# Patient Record
Sex: Female | Born: 1980 | Hispanic: Yes | Marital: Married | Smoking: Never smoker
Health system: Southern US, Community
[De-identification: ages and names within clinical notes are randomized; demographics above are authoritative.]

## PROBLEM LIST (undated history)

## (undated) DIAGNOSIS — Z789 Other specified health status: Secondary | ICD-10-CM

## (undated) HISTORY — PX: NO PAST SURGERIES: SHX2092

---

## 2015-06-09 NOTE — L&D Delivery Note (Signed)
Delivery Note Pt pushed for an hour and at 11:24 PM a viable female was delivered via Vaginal, Spontaneous Delivery (Presentation:LOA ;  ).  APGAR: 8, 9; weight pending .   Placenta status: delivered spontaneously; duncan , .  Cord:  with the following complications: none.  Anesthesia:  episiotomy Episiotomy: Median Lacerations: 2nd degree Suture Repair: 2.0 vicryl 4-0 vicryl Est. Blood Loss (mL): 150  Mom to postpartum.  Baby to Couplet care / Skin to Skin.  Cecilia Worema Banga 02/02/2016, 12:21 AM

## 2015-12-30 LAB — OB RESULTS CONSOLE GBS: GBS: NEGATIVE

## 2016-01-13 ENCOUNTER — Inpatient Hospital Stay (HOSPITAL_COMMUNITY)
Admission: AD | Admit: 2016-01-13 | Discharge: 2016-01-13 | Disposition: A | Discharge status: Home / Self Care | Payer: 59 | Source: Ambulatory Visit | Attending: Obstetrics and Gynecology | Admitting: Obstetrics and Gynecology

## 2016-01-13 ENCOUNTER — Inpatient Hospital Stay (HOSPITAL_COMMUNITY): Payer: 59

## 2016-01-13 ENCOUNTER — Encounter (HOSPITAL_COMMUNITY): Payer: Self-pay | Admitting: *Deleted

## 2016-01-13 DIAGNOSIS — R0602 Shortness of breath: Secondary | ICD-10-CM | POA: Insufficient documentation

## 2016-01-13 DIAGNOSIS — R0600 Dyspnea, unspecified: Secondary | ICD-10-CM | POA: Diagnosis not present

## 2016-01-13 DIAGNOSIS — O26893 Other specified pregnancy related conditions, third trimester: Secondary | ICD-10-CM | POA: Diagnosis not present

## 2016-01-13 DIAGNOSIS — Z8701 Personal history of pneumonia (recurrent): Secondary | ICD-10-CM | POA: Diagnosis not present

## 2016-01-13 DIAGNOSIS — J069 Acute upper respiratory infection, unspecified: Secondary | ICD-10-CM | POA: Insufficient documentation

## 2016-01-13 DIAGNOSIS — R05 Cough: Secondary | ICD-10-CM | POA: Insufficient documentation

## 2016-01-13 DIAGNOSIS — Z3A37 37 weeks gestation of pregnancy: Secondary | ICD-10-CM | POA: Diagnosis not present

## 2016-01-13 HISTORY — DX: Other specified health status: Z78.9

## 2016-01-13 LAB — CBC WITH DIFFERENTIAL/PLATELET
BAND NEUTROPHILS: 0 %
BASOS ABS: 0 10*3/uL (ref 0.0–0.1)
BASOS PCT: 0 %
Blasts: 0 %
EOS ABS: 0 10*3/uL (ref 0.0–0.7)
EOS PCT: 0 %
HCT: 37.6 % (ref 36.0–46.0)
HEMOGLOBIN: 12.8 g/dL (ref 12.0–15.0)
LYMPHS ABS: 1.8 10*3/uL (ref 0.7–4.0)
LYMPHS PCT: 17 %
MCH: 31.1 pg (ref 26.0–34.0)
MCHC: 34 g/dL (ref 30.0–36.0)
MCV: 91.3 fL (ref 78.0–100.0)
METAMYELOCYTES PCT: 0 %
MONO ABS: 0.5 10*3/uL (ref 0.1–1.0)
MYELOCYTES: 0 %
Monocytes Relative: 5 %
NEUTROS PCT: 78 %
Neutro Abs: 8.3 10*3/uL — ABNORMAL HIGH (ref 1.7–7.7)
Other: 0 %
PLATELETS: 251 10*3/uL (ref 150–400)
Promyelocytes Absolute: 0 %
RBC: 4.12 MIL/uL (ref 3.87–5.11)
RDW: 14.3 % (ref 11.5–15.5)
WBC: 10.6 10*3/uL — AB (ref 4.0–10.5)
nRBC: 0 /100 WBC

## 2016-01-13 LAB — URINALYSIS, ROUTINE W REFLEX MICROSCOPIC
BILIRUBIN URINE: NEGATIVE
Glucose, UA: NEGATIVE mg/dL
Hgb urine dipstick: NEGATIVE
KETONES UR: NEGATIVE mg/dL
NITRITE: NEGATIVE
Protein, ur: NEGATIVE mg/dL
pH: 6.5 (ref 5.0–8.0)

## 2016-01-13 LAB — URINE MICROSCOPIC-ADD ON

## 2016-01-13 NOTE — MAU Provider Note (Signed)
History     CSN: [id number]  Arrival date and time: 01/13/16 1402   First Provider Initiated Contact with Patient 01/13/16 1500      Chief Complaint  Patient presents with  . Shortness of Breath   HPI   Ms.Cassidy Schroeder is a 35 y.o. female here in MAU today with SOB. She started feeling sick 2 weeks ago; she started off having a cough, then head congestion. She feels that the symptoms never improved. 2 days ago she started experiencing shortness of breath that worsened with activity. She also feels short of breath while she is sitting in bed. The shortness of breath occurs all the time, day and night. Cleaning and moving around makes it worse. She has never had this before.   She has been taking robitussin for cough which is helping some.   She has a history of pneumonia. "This feels the same".  She denies fever.  + fetal movement, denies vaginal bleeding or leaking of fluid.   OB History    Gravida Para Term Preterm AB Living   0 3 2   SAB TAB Ectopic Multiple Live Births                  Past Medical History:  Diagnosis Date  . Medical history non-contributory     Past Surgical History:  Procedure Laterality Date  . NO PAST SURGERIES      Family History  Problem Relation Age of Onset  . Cancer Neg Hx   . Hypertension Neg Hx   . Diabetes Neg Hx     Social History  Substance Use Topics  . Smoking status: Never Smoker  . Smokeless tobacco: Never Used  . Alcohol use No    Allergies: No Known Allergies  Prescriptions Prior to Admission  Medication Sig Dispense Refill Last Dose  . guaiFENesin-dextromethorphan (ROBITUSSIN DM) 100-10 MG/5ML syrup Take 5 mLs by mouth every 4 (four) hours as needed for cough.   01/12/2016 at Unknown time  . Prenatal Vit-Fe Fumarate-FA (PRENATAL MULTIVITAMIN) TABS tablet Take 1 tablet by mouth daily at 12 noon.   01/13/2016 at Unknown time   Results for orders placed or performed during the hospital encounter of 01/13/16 (from  the past 48 hour(s))  Urinalysis, Routine w reflex microscopic (not at [facility])     Status: Abnormal   Collection Time: 01/13/16  2:15 PM  Result Value Ref Range   Color, Urine YELLOW YELLOW   APPearance CLEAR CLEAR   Specific Gravity, Urine <1.005 (L) 1.005 - 1.030   pH 6.5 5.0 - 8.0   Glucose, UA NEGATIVE NEGATIVE mg/dL   Hgb urine dipstick NEGATIVE NEGATIVE   Bilirubin Urine NEGATIVE NEGATIVE   Ketones, ur NEGATIVE NEGATIVE mg/dL   Protein, ur NEGATIVE NEGATIVE mg/dL   Nitrite NEGATIVE NEGATIVE   Leukocytes, UA SMALL (A) NEGATIVE  Urine microscopic-add on     Status: Abnormal   Collection Time: 01/13/16  2:15 PM  Result Value Ref Range   Squamous Epithelial / LPF 0-5 (A) NONE SEEN   WBC, UA 0-5 0 - 5 WBC/hpf   RBC / HPF 0-5 0 - 5 RBC/hpf   Bacteria, UA RARE (A) NONE SEEN  CBC with Differential     Status: Abnormal   Collection Time: 01/13/16  3:15 PM  Result Value Ref Range   WBC 10.6 (H) 4.0 - 10.5 K/uL   RBC 4.12 3.87 - 5.11 MIL/uL   Hemoglobin 12.8 12.0 - 15.0 g/dL  HCT 37.6 36.0 - 46.0 %   MCV 91.3 78.0 - 100.0 fL   MCH 31.1 26.0 - 34.0 pg   MCHC 34.0 30.0 - 36.0 g/dL   RDW 62.914.3 52.811.5 - 41.315.5 %   Platelets 251 150 - 400 K/uL   Neutrophils Relative % 78 %   Lymphocytes Relative 17 %   Monocytes Relative 5 %   Eosinophils Relative 0 %   Basophils Relative 0 %   Band Neutrophils 0 %   Metamyelocytes Relative 0 %   Myelocytes 0 %   Promyelocytes Absolute 0 %   Blasts 0 %   nRBC 0 0 /100 WBC   Other 0 %   Neutro Abs 8.3 (H) 1.7 - 7.7 K/uL   Lymphs Abs 1.8 0.7 - 4.0 K/uL   Monocytes Absolute 0.5 0.1 - 1.0 K/uL   Eosinophils Absolute 0.0 0.0 - 0.7 K/uL   Basophils Absolute 0.0 0.0 - 0.1 K/uL   Dg Chest 2 View  Result Date: 01/13/2016 CLINICAL DATA:  Cough for 2 weeks. Shortness of breath for 2 days. Initial encounter. EXAM: CHEST  2 VIEW COMPARISON:  None. FINDINGS: The lungs are clear. Heart size is normal. No pneumothorax or pleural effusion. No bony abnormality.  IMPRESSION: Negative chest. Electronically Signed   By: Drusilla Kannerhomas  Dalessio M.D.   On: 01/13/2016 15:36    Review of Systems  Constitutional: Negative for chills and fever.  Respiratory: Positive for cough and shortness of breath.    Physical Exam   Blood pressure 99/71, pulse 95, temperature 97.7 F (36.5 C), temperature source Oral, resp. rate 18, height 5\' 5"  (1.651 m), weight 158 lb (71.7 kg), SpO2 99 %.  Physical Exam  Constitutional: She is oriented to person, place, and time. Vital signs are normal. She appears well-developed and well-nourished.  Non-toxic appearance. She does not have a sickly appearance. She does not appear ill. No distress.  HENT:  Head: Normocephalic.  Eyes: Pupils are equal, round, and reactive to light.  Neck: Neck supple.  Cardiovascular: Normal rate.   Respiratory: Effort normal and breath sounds normal. No accessory muscle usage. No tachypnea. No respiratory distress.  Coughing not noted while I was in the room   Musculoskeletal: Normal range of motion.  Neurological: She is alert and oriented to person, place, and time.  Skin: Skin is warm. She is not diaphoretic.  Psychiatric: Her behavior is normal.   Fetal Tracing: Baseline: 145 bpm  Variability: Moderate  Accelerations: 15x15 Decelerations: quick variable Toco: occasional, with UI  MAU Course  Procedures  None  MDM  EKG (ordered by RN on arrival) normal  Patient remains 100 % on room air.  Chest Xray negative for acute findings  Discussed patient with Dr. Ellyn HackBovard.    Assessment and Plan   A:  1. Shortness of breath due to pregnancy, third trimester   2. Upper respiratory virus     P:  Discharge home in stable condition Labor precautions Fetal kick counts Return to MAU if symptoms worsen Keep next OB appointment Ok to continue robitussin as needed, for cough as directed on the bottle  Duane LopeJennifer I Rasch, NP 01/13/2016 4:37 PM

## 2016-01-13 NOTE — MAU Provider Note (Signed)
  NST 140-150, good var, category 1

## 2016-01-13 NOTE — MAU Note (Signed)
Urine sent to lab 

## 2016-01-13 NOTE — Discharge Instructions (Signed)
Upper Respiratory Infection, Adult Most upper respiratory infections (URIs) are a viral infection of the air passages leading to the lungs. A URI affects the nose, throat, and upper air passages. The most common type of URI is nasopharyngitis and is typically referred to as "the common cold." URIs run their course and usually go away on their own. Most of the time, a URI does not require medical attention, but sometimes a bacterial infection in the upper airways can follow a viral infection. This is called a secondary infection. Sinus and middle ear infections are common types of secondary upper respiratory infections. Bacterial pneumonia can also complicate a URI. A URI can worsen asthma and chronic obstructive pulmonary disease (COPD). Sometimes, these complications can require emergency medical care and may be life threatening.  CAUSES Almost all URIs are caused by viruses. A virus is a type of germ and can spread from one person to another.  RISKS FACTORS You may be at risk for a URI if:   You smoke.   You have chronic heart or lung disease.  You have a weakened defense (immune) system.   You are very young or very old.   You have nasal allergies or asthma.  You work in crowded or poorly ventilated areas.  You work in health care facilities or schools. SIGNS AND SYMPTOMS  Symptoms typically develop 2-3 days after you come in contact with a cold virus. Most viral URIs last 7-10 days. However, viral URIs from the influenza virus (flu virus) can last 14-18 days and are typically more severe. Symptoms may include:   Runny or stuffy (congested) nose.   Sneezing.   Cough.   Sore throat.   Headache.   Fatigue.   Fever.   Loss of appetite.   Pain in your forehead, behind your eyes, and over your cheekbones (sinus pain).  Muscle aches.  DIAGNOSIS  Your health care provider may diagnose a URI by:  Physical exam.  Tests to check that your symptoms are not due to  another condition such as:  Strep throat.  Sinusitis.  Pneumonia.  Asthma. TREATMENT  A URI goes away on its own with time. It cannot be cured with medicines, but medicines may be prescribed or recommended to relieve symptoms. Medicines may help:  Reduce your fever.  Reduce your cough.  Relieve nasal congestion. HOME CARE INSTRUCTIONS   Take medicines only as directed by your health care provider.   Gargle warm saltwater or take cough drops to comfort your throat as directed by your health care provider.  Use a warm mist humidifier or inhale steam from a shower to increase air moisture. This may make it easier to breathe.  Drink enough fluid to keep your urine clear or pale yellow.   Eat soups and other clear broths and maintain good nutrition.   Rest as needed.   Return to work when your temperature has returned to normal or as your health care provider advises. You may need to stay home longer to avoid infecting others. You can also use a face mask and careful hand washing to prevent spread of the virus.  Increase the usage of your inhaler if you have asthma.   Do not use any tobacco products, including cigarettes, chewing tobacco, or electronic cigarettes. If you need help quitting, ask your health care provider. PREVENTION  The best way to protect yourself from getting a cold is to practice good hygiene.   Avoid oral or hand contact with people with cold   symptoms.   Wash your hands often if contact occurs.  There is no clear evidence that vitamin C, vitamin E, echinacea, or exercise reduces the chance of developing a cold. However, it is always recommended to get plenty of rest, exercise, and practice good nutrition.  SEEK MEDICAL CARE IF:   You are getting worse rather than better.   Your symptoms are not controlled by medicine.   You have chills.  You have worsening shortness of breath.  You have brown or red mucus.  You have yellow or brown nasal  discharge.  You have pain in your face, especially when you bend forward.  You have a fever.  You have swollen neck glands.  You have pain while swallowing.  You have white areas in the back of your throat. SEEK IMMEDIATE MEDICAL CARE IF:   You have severe or persistent:  Headache.  Ear pain.  Sinus pain.  Chest pain.  You have chronic lung disease and any of the following:  Wheezing.  Prolonged cough.  Coughing up blood.  A change in your usual mucus.  You have a stiff neck.  You have changes in your:  Vision.  Hearing.  Thinking.  Mood. MAKE SURE YOU:   Understand these instructions.  Will watch your condition.  Will get help right away if you are not doing well or get worse.   This information is not intended to replace advice given to you by your health care provider. Make sure you discuss any questions you have with your health care provider.   Document Released: 11/18/2000 Document Revised: 10/09/2014 Document Reviewed: 08/30/2013 Elsevier Interactive Patient Education 2016 [employer]  

## 2016-01-27 ENCOUNTER — Encounter (HOSPITAL_COMMUNITY): Payer: Self-pay

## 2016-01-27 ENCOUNTER — Inpatient Hospital Stay (HOSPITAL_COMMUNITY)
Admission: AD | Admit: 2016-01-27 | Discharge: 2016-01-28 | Disposition: A | Discharge status: Home / Self Care | Payer: 59 | Source: Ambulatory Visit | Attending: Obstetrics and Gynecology | Admitting: Obstetrics and Gynecology

## 2016-01-27 DIAGNOSIS — Z3A00 Weeks of gestation of pregnancy not specified: Secondary | ICD-10-CM | POA: Insufficient documentation

## 2016-01-27 NOTE — MAU Note (Signed)
Pt presents with contractions, denies bleeding or ROM 

## 2016-01-27 NOTE — MAU Provider Note (Addendum)
Pt in MAU for painful contractions. No cervical change noted after walkingn for an hour - still 2cm dilated.  CAT 1 strip - 145-150, +accels -decels, moderate variability  A/P: Labor precautions to be reviewed         Pt may be offereed vistaril to help through latent labor         No beds in L/D so can return when contractions stronger or stay and get rechecked- pt opts to stay and recheck; if active labor will admit

## 2016-01-28 DIAGNOSIS — Z3A00 Weeks of gestation of pregnancy not specified: Secondary | ICD-10-CM | POA: Diagnosis not present

## 2016-01-28 MED ORDER — HYDROXYZINE HCL 25 MG PO TABS: 25.0000 mg | ORAL_TABLET | Freq: Once | ORAL | Status: DC

## 2016-01-28 NOTE — Progress Notes (Signed)
Notified of pt unchanged cervix, pain level, FHR tracing with early decelerations but reactive. Ok to discharge pt home. Will offer vistaril before discharge

## 2016-01-28 NOTE — Discharge Instructions (Signed)
Third Trimester of Pregnancy °The third trimester is from week 29 through week 42, months 7 through 9. The third trimester is a time when the fetus is growing rapidly. At the end of the ninth month, the fetus is about 20 inches in length and weighs 6-10 pounds.  °BODY CHANGES °Your body goes through many changes during pregnancy. The changes vary from woman to woman.  °· Your weight will continue to increase. You can expect to gain 25-35 pounds (11-16 kg) by the end of the pregnancy. °· You may begin to get stretch marks on your hips, abdomen, and breasts. °· You may urinate more often because the fetus is moving lower into your pelvis and pressing on your bladder. °· You may develop or continue to have heartburn as a result of your pregnancy. °· You may develop constipation because certain hormones are causing the muscles that push waste through your intestines to slow down. °· You may develop hemorrhoids or swollen, bulging veins (varicose veins). °· You may have pelvic pain because of the weight gain and pregnancy hormones relaxing your joints between the bones in your pelvis. Backaches may result from overexertion of the muscles supporting your posture. °· You may have changes in your hair. These can include thickening of your hair, rapid growth, and changes in texture. Some women also have hair loss during or after pregnancy, or hair that feels dry or thin. Your hair will most likely return to normal after your baby is born. °· Your breasts will continue to grow and be tender. A yellow discharge may leak from your breasts called colostrum. °· Your belly button may stick out. °· You may feel short of breath because of your expanding uterus. °· You may notice the fetus "dropping," or moving lower in your abdomen. °· You may have a bloody mucus discharge. This usually occurs a few days to a week before labor begins. °· Your cervix becomes thin and soft (effaced) near your due date. °WHAT TO EXPECT AT YOUR PRENATAL  EXAMS  °You will have prenatal exams every 2 weeks until week 36. Then, you will have weekly prenatal exams. During a routine prenatal visit: °· You will be weighed to make sure you and the fetus are growing normally. °· Your blood pressure is taken. °· Your abdomen will be measured to track your baby's growth. °· The fetal heartbeat will be listened to. °· Any test results from the previous visit will be discussed. °· You may have a cervical check near your due date to see if you have effaced. °At around 36 weeks, your caregiver will check your cervix. At the same time, your caregiver will also perform a test on the secretions of the vaginal tissue. This test is to determine if a type of bacteria, Group B streptococcus, is present. Your caregiver will explain this further. °Your caregiver may ask you: °· What your birth plan is. °· How you are feeling. °· If you are feeling the baby move. °· If you have had any abnormal symptoms, such as leaking fluid, bleeding, severe headaches, or abdominal cramping. °· If you are using any tobacco products, including cigarettes, chewing tobacco, and electronic cigarettes. °· If you have any questions. °Other tests or screenings that may be performed during your third trimester include: °· Blood tests that check for low iron levels (anemia). °· Fetal testing to check the health, activity level, and growth of the fetus. Testing is done if you have certain medical conditions or if   there are problems during the pregnancy. °· HIV (human immunodeficiency virus) testing. If you are at high risk, you may be screened for HIV during your third trimester of pregnancy. °FALSE LABOR °You may feel small, irregular contractions that eventually go away. These are called Braxton Hicks contractions, or false labor. Contractions may last for hours, days, or even weeks before true labor sets in. If contractions come at regular intervals, intensify, or become painful, it is best to be seen by your  caregiver.  °SIGNS OF LABOR  °· Menstrual-like cramps. °· Contractions that are 5 minutes apart or less. °· Contractions that start on the top of the uterus and spread down to the lower abdomen and back. °· A sense of increased pelvic pressure or back pain. °· A watery or bloody mucus discharge that comes from the vagina. °If you have any of these signs before the 37th week of pregnancy, call your caregiver right away. You need to go to the hospital to get checked immediately. °HOME CARE INSTRUCTIONS  °· Avoid all smoking, herbs, alcohol, and unprescribed drugs. These chemicals affect the formation and growth of the baby. °· Do not use any tobacco products, including cigarettes, chewing tobacco, and electronic cigarettes. If you need help quitting, ask your health care provider. You may receive counseling support and other resources to help you quit. °· Follow your caregiver's instructions regarding medicine use. There are medicines that are either safe or unsafe to take during pregnancy. °· Exercise only as directed by your caregiver. Experiencing uterine cramps is a good sign to stop exercising. °· Continue to eat regular, healthy meals. °· Wear a good support bra for breast tenderness. °· Do not use hot tubs, steam rooms, or saunas. °· Wear your seat belt at all times when driving. °· Avoid raw meat, uncooked cheese, cat litter boxes, and soil used by cats. These carry germs that can cause birth defects in the baby. °· Take your prenatal vitamins. °· Take 1500-2000 mg of calcium daily starting at the 20th week of pregnancy until you deliver your baby. °· Try taking a stool softener (if your caregiver approves) if you develop constipation. Eat more high-fiber foods, such as fresh vegetables or fruit and whole grains. Drink plenty of fluids to keep your urine clear or pale yellow. °· Take warm sitz baths to soothe any pain or discomfort caused by hemorrhoids. Use hemorrhoid cream if your caregiver approves. °· If  you develop varicose veins, wear support hose. Elevate your feet for 15 minutes, 3-4 times a day. Limit salt in your diet. °· Avoid heavy lifting, wear low heal shoes, and practice good posture. °· Rest a lot with your legs elevated if you have leg cramps or low back pain. °· Visit your dentist if you have not gone during your pregnancy. Use a soft toothbrush to brush your teeth and be gentle when you floss. °· A sexual relationship may be continued unless your caregiver directs you otherwise. °· Do not travel far distances unless it is absolutely necessary and only with the approval of your caregiver. °· Take prenatal classes to understand, practice, and ask questions about the labor and delivery. °· Make a trial run to the hospital. °· Pack your hospital bag. °· Prepare the baby's nursery. °· Continue to go to all your prenatal visits as directed by your caregiver. °SEEK MEDICAL CARE IF: °· You are unsure if you are in labor or if your water has broken. °· You have dizziness. °· You have   mild pelvic cramps, pelvic pressure, or nagging pain in your abdominal area. °· You have persistent nausea, vomiting, or diarrhea. °· You have a bad smelling vaginal discharge. °· You have pain with urination. °SEEK IMMEDIATE MEDICAL CARE IF:  °· You have a fever. °· You are leaking fluid from your vagina. °· You have spotting or bleeding from your vagina. °· You have severe abdominal cramping or pain. °· You have rapid weight loss or gain. °· You have shortness of breath with chest pain. °· You notice sudden or extreme swelling of your face, hands, ankles, feet, or legs. °· You have not felt your baby move in over an hour. °· You have severe headaches that do not go away with medicine. °· You have vision changes. °  °This information is not intended to replace advice given to you by your health care provider. Make sure you discuss any questions you have with your health care provider. °  °Document Released: 05/19/2001 Document  Revised: 06/15/2014 Document Reviewed: 07/26/2012 °Elsevier Interactive Patient Education ©2016 [employer] °Fetal Movement Counts °Patient Name: __________________________________________________ Patient Due Date: ____________________ °Performing a fetal movement count is highly recommended in high-risk pregnancies, but it is good for every pregnant woman to do. Your health care provider may ask you to start counting fetal movements at 28 weeks of the pregnancy. Fetal movements often increase: °· After eating a full meal. °· After physical activity. °· After eating or drinking something sweet or cold. °· At rest. °Pay attention to when you feel the baby is most active. This will help you notice a pattern of your baby's sleep and wake cycles and what factors contribute to an increase in fetal movement. It is important to perform a fetal movement count at the same time each day when your baby is normally most active.  °HOW TO COUNT FETAL MOVEMENTS °1. Find a quiet and comfortable area to sit or lie down on your left side. Lying on your left side provides the best blood and oxygen circulation to your baby. °2. Write down the day and time on a sheet of paper or in a journal. °3. Start counting kicks, flutters, swishes, rolls, or jabs in a 2-hour period. You should feel at least 10 movements within 2 hours. °4. If you do not feel 10 movements in 2 hours, wait 2-3 hours and count again. Look for a change in the pattern or not enough counts in 2 hours. °SEEK MEDICAL CARE IF: °· You feel less than 10 counts in 2 hours, tried twice. °· There is no movement in over an hour. °· The pattern is changing or taking longer each day to reach 10 counts in 2 hours. °· You feel the baby is not moving as he or she usually does. °Date: ____________ Movements: ____________ Start time: ____________ Finish time: ____________  °Date: ____________ Movements: ____________ Start time: ____________ Finish time: ____________ °Date:  ____________ Movements: ____________ Start time: ____________ Finish time: ____________ °Date: ____________ Movements: ____________ Start time: ____________ Finish time: ____________ °Date: ____________ Movements: ____________ Start time: ____________ Finish time: ____________ °Date: ____________ Movements: ____________ Start time: ____________ Finish time: ____________ °Date: ____________ Movements: ____________ Start time: ____________ Finish time: ____________ °Date: ____________ Movements: ____________ Start time: ____________ Finish time: ____________  °Date: ____________ Movements: ____________ Start time: ____________ Finish time: ____________ °Date: ____________ Movements: ____________ Start time: ____________ Finish time: ____________ °Date: ____________ Movements: ____________ Start time: ____________ Finish time: ____________ °Date: ____________ Movements: ____________ Start time: ____________ Finish time: ____________ °Date:   ____________ Movements: ____________ Start time: ____________ Finish time: ____________ °Date: ____________ Movements: ____________ Start time: ____________ Finish time: ____________ °Date: ____________ Movements: ____________ Start time: ____________ Finish time: ____________  °Date: ____________ Movements: ____________ Start time: ____________ Finish time: ____________ °Date: ____________ Movements: ____________ Start time: ____________ Finish time: ____________ °Date: ____________ Movements: ____________ Start time: ____________ Finish time: ____________ °Date: ____________ Movements: ____________ Start time: ____________ Finish time: ____________ °Date: ____________ Movements: ____________ Start time: ____________ Finish time: ____________ °Date: ____________ Movements: ____________ Start time: ____________ Finish time: ____________ °Date: ____________ Movements: ____________ Start time: ____________ Finish time: ____________  °Date: ____________ Movements: ____________ Start  time: ____________ Finish time: ____________ °Date: ____________ Movements: ____________ Start time: ____________ Finish time: ____________ °Date: ____________ Movements: ____________ Start time: ____________ Finish time: ____________ °Date: ____________ Movements: ____________ Start time: ____________ Finish time: ____________ °Date: ____________ Movements: ____________ Start time: ____________ Finish time: ____________ °Date: ____________ Movements: ____________ Start time: ____________ Finish time: ____________ °Date: ____________ Movements: ____________ Start time: ____________ Finish time: ____________  °Date: ____________ Movements: ____________ Start time: ____________ Finish time: ____________ °Date: ____________ Movements: ____________ Start time: ____________ Finish time: ____________ °Date: ____________ Movements: ____________ Start time: ____________ Finish time: ____________ °Date: ____________ Movements: ____________ Start time: ____________ Finish time: ____________ °Date: ____________ Movements: ____________ Start time: ____________ Finish time: ____________ °Date: ____________ Movements: ____________ Start time: ____________ Finish time: ____________ °Date: ____________ Movements: ____________ Start time: ____________ Finish time: ____________  °Date: ____________ Movements: ____________ Start time: ____________ Finish time: ____________ °Date: ____________ Movements: ____________ Start time: ____________ Finish time: ____________ °Date: ____________ Movements: ____________ Start time: ____________ Finish time: ____________ °Date: ____________ Movements: ____________ Start time: ____________ Finish time: ____________ °Date: ____________ Movements: ____________ Start time: ____________ Finish time: ____________ °Date: ____________ Movements: ____________ Start time: ____________ Finish time: ____________ °Date: ____________ Movements: ____________ Start time: ____________ Finish time: ____________    °Date: ____________ Movements: ____________ Start time: ____________ Finish time: ____________ °Date: ____________ Movements: ____________ Start time: ____________ Finish time: ____________ °Date: ____________ Movements: ____________ Start time: ____________ Finish time: ____________ °Date: ____________ Movements: ____________ Start time: ____________ Finish time: ____________ °Date: ____________ Movements: ____________ Start time: ____________ Finish time: ____________ °Date: ____________ Movements: ____________ Start time: ____________ Finish time: ____________ °Date: ____________ Movements: ____________ Start time: ____________ Finish time: ____________  °Date: ____________ Movements: ____________ Start time: ____________ Finish time: ____________ °Date: ____________ Movements: ____________ Start time: ____________ Finish time: ____________ °Date: ____________ Movements: ____________ Start time: ____________ Finish time: ____________ °Date: ____________ Movements: ____________ Start time: ____________ Finish time: ____________ °Date: ____________ Movements: ____________ Start time: ____________ Finish time: ____________ °Date: ____________ Movements: ____________ Start time: ____________ Finish time: ____________ °  °This information is not intended to replace advice given to you by your health care provider. Make sure you discuss any questions you have with your health care provider. °  °Document Released: 06/24/2006 Document Revised: 06/15/2014 Document Reviewed: 03/21/2012 °Elsevier Interactive Patient Education ©2016 [employer] °Braxton Hicks Contractions °Contractions of the uterus can occur throughout pregnancy. Contractions are not always a sign that you are in labor.  °WHAT ARE BRAXTON HICKS CONTRACTIONS?  °Contractions that occur before labor are called Braxton Hicks contractions, or false labor. Toward the end of pregnancy (32-34 weeks), these contractions can develop more often and may become more  forceful. This is not true labor because these contractions do not result in opening (dilatation) and thinning of the cervix. They are sometimes difficult to tell apart from true labor because these contractions can be forceful and people have different pain tolerances. You   should not feel embarrassed if you go to the hospital with false labor. Sometimes, the only way to tell if you are in true labor is for your health care provider to look for changes in the cervix. °If there are no prenatal problems or other health problems associated with the pregnancy, it is completely safe to be sent home with false labor and await the onset of true labor. °HOW CAN YOU TELL THE DIFFERENCE BETWEEN TRUE AND FALSE LABOR? °False Labor °· The contractions of false labor are usually shorter and not as hard as those of true labor.   °· The contractions are usually irregular.   °· The contractions are often felt in the front of the lower abdomen and in the groin.   °· The contractions may go away when you walk around or change positions while lying down.   °· The contractions get weaker and are shorter lasting as time goes on.   °· The contractions do not usually become progressively stronger, regular, and closer together as with true labor.   °True Labor °· Contractions in true labor last 30-70 seconds, become very regular, usually become more intense, and increase in frequency.   °· The contractions do not go away with walking.   °· The discomfort is usually felt in the top of the uterus and spreads to the lower abdomen and low back.   °· True labor can be determined by your health care provider with an exam. This will show that the cervix is dilating and getting thinner.   °WHAT TO REMEMBER °· Keep up with your usual exercises and follow other instructions given by your health care provider.   °· Take medicines as directed by your health care provider.   °· Keep your regular prenatal appointments.   °· Eat and drink lightly if you  think you are going into labor.   °· If Braxton Hicks contractions are making you uncomfortable:   °¨ Change your position from lying down or resting to walking, or from walking to resting.   °¨ Sit and rest in a tub of warm water.   °¨ Drink 2-3 glasses of water. Dehydration may cause these contractions.   °¨ Do slow and deep breathing several times an hour.   °WHEN SHOULD I SEEK IMMEDIATE MEDICAL CARE? °Seek immediate medical care if: °· Your contractions become stronger, more regular, and closer together.   °· You have fluid leaking or gushing from your vagina.   °· You have a fever.   °· You pass blood-tinged mucus.   °· You have vaginal bleeding.   °· You have continuous abdominal pain.   °· You have low back pain that you never had before.   °· You feel your baby's head pushing down and causing pelvic pressure.   °· Your baby is not moving as much as it used to.   °  °This information is not intended to replace advice given to you by your health care provider. Make sure you discuss any questions you have with your health care provider. °  °Document Released: 05/25/2005 Document Revised: 05/30/2013 Document Reviewed: 03/06/2013 °Elsevier Interactive Patient Education ©2016 [employer] ° °

## 2016-02-01 ENCOUNTER — Encounter (HOSPITAL_COMMUNITY): Payer: Self-pay

## 2016-02-01 ENCOUNTER — Inpatient Hospital Stay (HOSPITAL_COMMUNITY): Payer: 59 | Admitting: Anesthesiology

## 2016-02-01 ENCOUNTER — Inpatient Hospital Stay (HOSPITAL_COMMUNITY)
Admission: AD | Admit: 2016-02-01 | Discharge: 2016-02-03 | DRG: 775 | Disposition: A | Discharge status: Home / Self Care | Payer: 59 | Source: Ambulatory Visit | Attending: Obstetrics and Gynecology | Admitting: Obstetrics and Gynecology

## 2016-02-01 DIAGNOSIS — Z3A39 39 weeks gestation of pregnancy: Secondary | ICD-10-CM

## 2016-02-01 DIAGNOSIS — Z3490 Encounter for supervision of normal pregnancy, unspecified, unspecified trimester: Secondary | ICD-10-CM

## 2016-02-01 DIAGNOSIS — Z3483 Encounter for supervision of other normal pregnancy, third trimester: Secondary | ICD-10-CM | POA: Diagnosis present

## 2016-02-01 LAB — TYPE AND SCREEN
ABO/RH(D): O POS
Antibody Screen: NEGATIVE

## 2016-02-01 LAB — CBC
HCT: 36.8 % (ref 36.0–46.0)
Hemoglobin: 12.6 g/dL (ref 12.0–15.0)
MCH: 31.4 pg (ref 26.0–34.0)
MCHC: 34.2 g/dL (ref 30.0–36.0)
MCV: 91.8 fL (ref 78.0–100.0)
PLATELETS: 243 10*3/uL (ref 150–400)
RBC: 4.01 MIL/uL (ref 3.87–5.11)
RDW: 14.7 % (ref 11.5–15.5)
WBC: 11.5 10*3/uL — ABNORMAL HIGH (ref 4.0–10.5)

## 2016-02-01 LAB — ABO/RH: ABO/RH(D): O POS

## 2016-02-01 MED ORDER — FENTANYL CITRATE (PF) 100 MCG/2ML IJ SOLN
INTRAMUSCULAR | Status: DC | PRN
  Administered 2016-02-01 (×2): 50 ug via EPIDURAL

## 2016-02-01 MED ORDER — PHENYLEPHRINE 40 MCG/ML (10ML) SYRINGE FOR IV PUSH (FOR BLOOD PRESSURE SUPPORT)
80.0000 ug | PREFILLED_SYRINGE | INTRAVENOUS | Status: DC | PRN
  Filled 2016-02-01: qty 5

## 2016-02-01 MED ORDER — ONDANSETRON HCL 4 MG/2ML IJ SOLN: 4.0000 mg | Freq: Four times a day (QID) | INTRAMUSCULAR | Status: DC | PRN

## 2016-02-01 MED ORDER — OXYTOCIN 40 UNITS IN LACTATED RINGERS INFUSION - SIMPLE MED
1.0000 m[IU]/min | INTRAVENOUS | Status: DC
  Administered 2016-02-01: 2 m[IU]/min via INTRAVENOUS
  Filled 2016-02-01: qty 1000

## 2016-02-01 MED ORDER — OXYTOCIN 40 UNITS IN LACTATED RINGERS INFUSION - SIMPLE MED: 2.5000 [IU]/h | INTRAVENOUS | Status: DC

## 2016-02-01 MED ORDER — FENTANYL CITRATE (PF) 100 MCG/2ML IJ SOLN
INTRAMUSCULAR | Status: AC
  Filled 2016-02-01: qty 2

## 2016-02-01 MED ORDER — OXYCODONE-ACETAMINOPHEN 5-325 MG PO TABS: 1.0000 | ORAL_TABLET | ORAL | Status: DC | PRN

## 2016-02-01 MED ORDER — LACTATED RINGERS IV SOLN: 500.0000 mL | INTRAVENOUS | Status: DC | PRN

## 2016-02-01 MED ORDER — BUPIVACAINE HCL (PF) 0.25 % IJ SOLN
INTRAMUSCULAR | Status: DC | PRN
  Administered 2016-02-01 (×2): 4 mL via EPIDURAL

## 2016-02-01 MED ORDER — FENTANYL 2.5 MCG/ML BUPIVACAINE 1/10 % EPIDURAL INFUSION (WH - ANES)
14.0000 mL/h | INTRAMUSCULAR | Status: DC | PRN
  Administered 2016-02-01: 14 mL/h via EPIDURAL
  Filled 2016-02-01: qty 125

## 2016-02-01 MED ORDER — SOD CITRATE-CITRIC ACID 500-334 MG/5ML PO SOLN: 30.0000 mL | ORAL | Status: DC | PRN

## 2016-02-01 MED ORDER — LACTATED RINGERS IV SOLN
INTRAVENOUS | Status: DC
  Administered 2016-02-01 (×2): via INTRAVENOUS

## 2016-02-01 MED ORDER — OXYCODONE-ACETAMINOPHEN 5-325 MG PO TABS: 2.0000 | ORAL_TABLET | ORAL | Status: DC | PRN

## 2016-02-01 MED ORDER — LIDOCAINE HCL (PF) 1 % IJ SOLN
30.0000 mL | INTRAMUSCULAR | Status: DC | PRN
  Administered 2016-02-01: 30 mL via SUBCUTANEOUS
  Filled 2016-02-01: qty 30

## 2016-02-01 MED ORDER — EPHEDRINE 5 MG/ML INJ
10.0000 mg | INTRAVENOUS | Status: DC | PRN
  Filled 2016-02-01: qty 4

## 2016-02-01 MED ORDER — OXYTOCIN BOLUS FROM INFUSION
500.0000 mL | Freq: Once | INTRAVENOUS | Status: AC
  Administered 2016-02-01: 500 mL via INTRAVENOUS

## 2016-02-01 MED ORDER — ACETAMINOPHEN 325 MG PO TABS: 650.0000 mg | ORAL_TABLET | ORAL | Status: DC | PRN

## 2016-02-01 MED ORDER — DIPHENHYDRAMINE HCL 50 MG/ML IJ SOLN: 12.5000 mg | INTRAMUSCULAR | Status: DC | PRN

## 2016-02-01 MED ORDER — TERBUTALINE SULFATE 1 MG/ML IJ SOLN
0.2500 mg | Freq: Once | INTRAMUSCULAR | Status: DC | PRN
  Filled 2016-02-01: qty 1

## 2016-02-01 MED ORDER — FLEET ENEMA 7-19 GM/118ML RE ENEM: 1.0000 | ENEMA | RECTAL | Status: DC | PRN

## 2016-02-01 MED ORDER — LACTATED RINGERS IV SOLN
500.0000 mL | Freq: Once | INTRAVENOUS | Status: AC
  Administered 2016-02-01: 500 mL via INTRAVENOUS

## 2016-02-01 MED ORDER — LIDOCAINE HCL (PF) 1 % IJ SOLN
INTRAMUSCULAR | Status: DC | PRN
  Administered 2016-02-01 (×2): 5 mL

## 2016-02-01 MED ORDER — PHENYLEPHRINE 40 MCG/ML (10ML) SYRINGE FOR IV PUSH (FOR BLOOD PRESSURE SUPPORT)
80.0000 ug | PREFILLED_SYRINGE | INTRAVENOUS | Status: DC | PRN
  Filled 2016-02-01: qty 10
  Filled 2016-02-01: qty 5

## 2016-02-01 NOTE — Anesthesia Pain Management Evaluation Note (Signed)
  [facility] Pain Management Visit Note  Patient: Cassidy Schroeder, 35 y.o., female  "[facility]. We have an anesthesia team available at all times to provide care throughout the hospital, including epidural management and anesthesia for C-section. I don't know your plan for the delivery whether it a natural birth, water birth, IV sedation, nitrous supplementation, doula or epidural, but we want to meet your pain goals."   1.Was your pain managed to your expectations on prior hospitalizations?   Yes   2.What is your expectation for pain management during this hospitalization?     Epidural  3.How can we help you reach that goal? Assist as needed  Record the patient's initial score and the patient's pain goal.   Pain: 0  Pain Goal: 5 [facility] wants you to be able to say your pain was always managed very well.  [facility],Audrey Thull 02/01/2016

## 2016-02-01 NOTE — H&P (Signed)
Cassidy Schroeder is a 35 y.o. Schroeder presenting for iol for term with favorable cervix. Pt has c/o of irregualr painful contractions for several weeks now. She has slowly changed from 1-3cm. No VB or LOF. Prenatal course benign. Dating based on 8weeks US OB History    Gravida Para Term Preterm AB Living   6 2 2  0 3 2   SAB TAB Ectopic Multiple Live Births                 Past Medical History:  Diagnosis Date  . Medical history non-contributory    Past Surgical History:  Procedure Laterality Date  . NO PAST SURGERIES     Family History: family history is not on file. Social History:  reports that she has never smoked. She has never used smokeless tobacco. She reports that she does not drink alcohol or use drugs.     Maternal Diabetes: No Genetic Screening: Declined Maternal Ultrasounds/Referrals: Normal Fetal Ultrasounds or other Referrals:  None Maternal Substance Abuse:  No Significant Maternal Medications:  None Significant Maternal Lab Results:  Lab values include: Group B Strep negative Other Comments:  None  Review of Systems  Constitutional: Positive for malaise/fatigue. Negative for chills, fever and weight loss.  Eyes: Negative for blurred vision.  Respiratory: Negative for shortness of breath.   Cardiovascular: Negative for chest pain.  Gastrointestinal: Positive for abdominal pain. Negative for heartburn, nausea and vomiting.  Genitourinary: Negative for dysuria.  Musculoskeletal: Positive for back pain.  Skin: Negative for itching and rash.  Neurological: Negative for dizziness and headaches.  Endo/Heme/Allergies: Does not bruise/bleed easily.  Psychiatric/Behavioral: Negative for depression, hallucinations, substance abuse and suicidal ideas. The patient is nervous/anxious.    Maternal Medical History:  Reason for admission: Contractions.  Nausea.  Contractions: Onset was 1 week or more ago.   Frequency: irregular.   Perceived severity is moderate.     Fetal activity: Perceived fetal activity is normal.   Last perceived fetal movement was within the past hour.    Prenatal complications: no prenatal complications Prenatal Complications - Diabetes: none.    Dilation: 3 Effacement (%): 70 Station: -3 Exam by:: Dr Mindi Slicker  Blood pressure 114/80, pulse 94, temperature 98.3 F (36.8 C), temperature source Oral, height 5\' 5"  (1.651 m), weight 161 lb (73 kg). Maternal Exam:  Uterine Assessment: Contraction strength is mild.  Contraction frequency is irregular.   Abdomen: Patient reports generalized tenderness.  Estimated fetal weight is AGA.   Fetal presentation: vertex  Introitus: Normal vulva. Normal vagina.  Pelvis: adequate for delivery.   Cervix: Cervix evaluated by digital exam.     Physical Exam  Constitutional: She is oriented to person, place, and time. She appears well-developed and well-nourished.  HENT:  Head: Normocephalic.  Eyes: Pupils are equal, round, and reactive to light.  Neck: Normal range of motion.  Cardiovascular: Normal rate.   Respiratory: Effort normal.  GI: Soft. There is generalized tenderness.  Genitourinary: Vagina normal and uterus normal.  Musculoskeletal: Normal range of motion. She exhibits no edema.  Neurological: She is alert and oriented to person, place, and time.  Skin: Skin is warm.  Psychiatric: She has a normal mood and affect. Her behavior is normal. Judgment and thought content normal.    Prenatal labs: ABO, Rh: --/--/O POS (08/26 1529) Antibody: NEG (08/26 1529) Rubella:   RPR:    HBsAg:    HIV:    GBS: Negative (07/24 0000)   Assessment/Plan: [id number] at 39 5/[redacted]wks gestation for  IOL at term with favorable cervix GBS neg Comfortable with epidural  AROM with moderate clear fluid return noted Pit per protocol Anticipate svd  [facility] 02/01/2016, 5:53 PM

## 2016-02-01 NOTE — Anesthesia Procedure Notes (Signed)
Epidural Patient location during procedure: OB  Staffing Anesthesiologist: Isys Tietje Performed: anesthesiologist   Preanesthetic Checklist Completed: patient identified, site marked, surgical consent, pre-op evaluation, timeout performed, IV checked, risks and benefits discussed and monitors and equipment checked  Epidural Patient position: sitting Prep: DuraPrep Patient monitoring: heart rate, continuous pulse ox and blood pressure Approach: midline Location: L3-L4 Injection technique: LOR saline  Needle:  Needle type: Tuohy  Needle gauge: 17 G Needle length: 9 cm and 9 Needle insertion depth: 5 cm Catheter type: closed end flexible Catheter size: 20 Guage Catheter at skin depth: 9 cm Test dose: negative  Assessment Events: blood not aspirated, injection not painful, no injection resistance, negative IV test and no paresthesia  Additional Notes Patient identified. Risks/Benefits/Options discussed with patient including but not limited to bleeding, infection, nerve damage, paralysis, failed block, incomplete pain control, headache, blood pressure changes, nausea, vomiting, reactions to medication both or allergic, itching and postpartum back pain. Confirmed with bedside nurse the patient's most recent platelet count. Confirmed with patient that they are not currently taking any anticoagulation, have any bleeding history or any family history of bleeding disorders. Patient expressed understanding and wished to proceed. All questions were answered. Sterile technique was used throughout the entire procedure. Please see nursing notes for vital signs. Test dose was given through epidural needle and negative prior to continuing to dose epidural or start infusion. Warning signs of high block given to the patient including shortness of breath, tingling/numbness in hands, complete motor block, or any concerning symptoms with instructions to call for help. Patient was given instructions  on fall risk and not to get out of bed. All questions and concerns addressed with instructions to call with any issues.     

## 2016-02-01 NOTE — Anesthesia Preprocedure Evaluation (Signed)
Anesthesia Evaluation  Patient identified by MRN, date of birth, ID band Patient awake    Reviewed: Allergy & Precautions, H&P , NPO status , Patient's Chart, lab work & pertinent test results, reviewed documented beta blocker date and time   Airway Mallampati: II  TM Distance: >3 FB Neck ROM: full    Dental no notable dental hx.    Pulmonary neg pulmonary ROS,    Pulmonary exam normal breath sounds clear to auscultation       Cardiovascular Exercise Tolerance: Good negative cardio ROS   Rhythm:regular Rate:Normal     Neuro/Psych negative neurological ROS  negative psych ROS   GI/Hepatic negative GI ROS, Neg liver ROS,   Endo/Other  negative endocrine ROS  Renal/GU negative Renal ROS  negative genitourinary   Musculoskeletal   Abdominal   Peds  Hematology negative hematology ROS (+)   Anesthesia Other Findings   Reproductive/Obstetrics negative OB ROS                             Anesthesia Physical Anesthesia Plan  ASA: II  Anesthesia Plan: General   Post-op Pain Management:    Induction:   Airway Management Planned:   Additional Equipment:   Intra-op Plan:   Post-operative Plan:   Informed Consent: I have reviewed the patients History and Physical, chart, labs and discussed the procedure including the risks, benefits and alternatives for the proposed anesthesia with the patient or authorized representative who has indicated his/her understanding and acceptance.   Dental Advisory Given  Plan Discussed with: CRNA  Anesthesia Plan Comments:         Anesthesia Quick Evaluation  

## 2016-02-02 ENCOUNTER — Encounter (HOSPITAL_COMMUNITY): Payer: Self-pay | Admitting: Obstetrics and Gynecology

## 2016-02-02 LAB — CBC
HCT: 34.7 % — ABNORMAL LOW (ref 36.0–46.0)
HEMOGLOBIN: 11.7 g/dL — AB (ref 12.0–15.0)
MCH: 30.9 pg (ref 26.0–34.0)
MCHC: 33.7 g/dL (ref 30.0–36.0)
MCV: 91.6 fL (ref 78.0–100.0)
PLATELETS: 216 10*3/uL (ref 150–400)
RBC: 3.79 MIL/uL — AB (ref 3.87–5.11)
RDW: 14.8 % (ref 11.5–15.5)
WBC: 13.7 10*3/uL — AB (ref 4.0–10.5)

## 2016-02-02 LAB — RPR: RPR Ser Ql: NONREACTIVE

## 2016-02-02 MED ORDER — SODIUM CHLORIDE 0.9% FLUSH: 3.0000 mL | INTRAVENOUS | Status: DC | PRN

## 2016-02-02 MED ORDER — SIMETHICONE 80 MG PO CHEW: 80.0000 mg | CHEWABLE_TABLET | ORAL | Status: DC | PRN

## 2016-02-02 MED ORDER — DOCUSATE SODIUM 100 MG PO CAPS
100.0000 mg | ORAL_CAPSULE | Freq: Two times a day (BID) | ORAL | Status: DC
  Administered 2016-02-02: 100 mg via ORAL
  Filled 2016-02-02: qty 1

## 2016-02-02 MED ORDER — ONDANSETRON HCL 4 MG PO TABS: 4.0000 mg | ORAL_TABLET | ORAL | Status: DC | PRN

## 2016-02-02 MED ORDER — SODIUM CHLORIDE 0.9% FLUSH: 3.0000 mL | Freq: Two times a day (BID) | INTRAVENOUS | Status: DC

## 2016-02-02 MED ORDER — ACETAMINOPHEN 325 MG PO TABS
650.0000 mg | ORAL_TABLET | ORAL | Status: DC | PRN
  Administered 2016-02-02: 650 mg via ORAL
  Filled 2016-02-02: qty 2

## 2016-02-02 MED ORDER — ZOLPIDEM TARTRATE 5 MG PO TABS: 5.0000 mg | ORAL_TABLET | Freq: Every evening | ORAL | Status: DC | PRN

## 2016-02-02 MED ORDER — COCONUT OIL OIL
1.0000 "application " | TOPICAL_OIL | Status: DC | PRN
  Filled 2016-02-02: qty 120

## 2016-02-02 MED ORDER — SODIUM CHLORIDE 0.9 % IV SOLN: 250.0000 mL | INTRAVENOUS | Status: DC | PRN

## 2016-02-02 MED ORDER — SENNOSIDES-DOCUSATE SODIUM 8.6-50 MG PO TABS
2.0000 | ORAL_TABLET | ORAL | Status: DC
  Administered 2016-02-02: 2 via ORAL
  Filled 2016-02-02: qty 2

## 2016-02-02 MED ORDER — IBUPROFEN 600 MG PO TABS
600.0000 mg | ORAL_TABLET | Freq: Four times a day (QID) | ORAL | Status: DC
  Administered 2016-02-02 – 2016-02-03 (×6): 600 mg via ORAL
  Filled 2016-02-02 (×6): qty 1

## 2016-02-02 MED ORDER — PRENATAL MULTIVITAMIN CH
1.0000 | ORAL_TABLET | Freq: Every day | ORAL | Status: DC
  Administered 2016-02-02 – 2016-02-03 (×2): 1 via ORAL
  Filled 2016-02-02 (×2): qty 1

## 2016-02-02 MED ORDER — BENZOCAINE-MENTHOL 20-0.5 % EX AERO: 1.0000 "application " | INHALATION_SPRAY | CUTANEOUS | Status: DC | PRN

## 2016-02-02 MED ORDER — TETANUS-DIPHTH-ACELL PERTUSSIS 5-2.5-18.5 LF-MCG/0.5 IM SUSP
0.5000 mL | Freq: Once | INTRAMUSCULAR | Status: AC
  Administered 2016-02-03: 0.5 mL via INTRAMUSCULAR
  Filled 2016-02-02: qty 0.5

## 2016-02-02 MED ORDER — ONDANSETRON HCL 4 MG/2ML IJ SOLN: 4.0000 mg | INTRAMUSCULAR | Status: DC | PRN

## 2016-02-02 MED ORDER — DIBUCAINE 1 % RE OINT: 1.0000 "application " | TOPICAL_OINTMENT | RECTAL | Status: DC | PRN

## 2016-02-02 MED ORDER — WITCH HAZEL-GLYCERIN EX PADS: 1.0000 "application " | MEDICATED_PAD | CUTANEOUS | Status: DC | PRN

## 2016-02-02 MED ORDER — DIPHENHYDRAMINE HCL 25 MG PO CAPS: 25.0000 mg | ORAL_CAPSULE | Freq: Four times a day (QID) | ORAL | Status: DC | PRN

## 2016-02-02 NOTE — Anesthesia Postprocedure Evaluation (Signed)
Anesthesia Post Note  Patient: Cassidy Schroeder  Procedure(s) Performed: * No procedures listed *  Patient location during evaluation: Mother Baby Anesthesia Type: Epidural Level of consciousness: awake and alert, oriented and patient cooperative Pain management: pain level controlled Vital Signs Assessment: post-procedure vital signs reviewed and stable Respiratory status: spontaneous breathing Cardiovascular status: stable Postop Assessment: no headache, epidural receding, patient able to bend at knees and no signs of nausea or vomiting Anesthetic complications: no Comments: Denies pain.     Last Vitals:  Vitals:   02/02/16 0230 02/02/16 0615  BP: 107/69 98/60  Pulse: 96 79  Resp: 18 18  Temp: 36.9 C 36.8 C    Last Pain:  Vitals:   02/02/16 0615  TempSrc: Oral  PainSc: 1    Pain Goal: Patients Stated Pain Goal: 2 (02/01/16 2115)               Merrilyn PumaWRINKLE,Jalaine Riggenbach

## 2016-02-02 NOTE — Lactation Note (Signed)
This note was copied from a baby's chart. Lactation Consultation Note  Patient Name: Cassidy Schroeder Today's Date: 02/02/2016 Reason for consult: Initial assessment Breastfeeding consultation services and support information given and reviewed.  Mom states baby is latching easily and nursing well.  Reviewed feeding cues and instructed to feed with any cue.  Encouraged to call for assist/concerns prn.  Maternal Data Does the patient have breastfeeding experience prior to this delivery?: Yes  Feeding Length of feed: 30 min  LATCH Score/Interventions                      Lactation Tools Discussed/Used     Consult Status Consult Status: Follow-up Date: 02/03/16 Follow-up type: In-patient    Huston FoleyMOULDEN, [name] 02/02/2016, 2:39 PM

## 2016-02-02 NOTE — Progress Notes (Signed)
Post Partum Day 1 Subjective: no complaints, up ad lib, voiding, tolerating PO, + flatus and bonding well with baby- breastfeeding. No complaints. Lochia mild  Objective: Blood pressure 98/60, pulse 79, temperature 98.3 F (36.8 C), temperature source Oral, resp. rate 18, height 5\' 5"  (1.651 m), weight 161 lb (73 kg), SpO2 99 %, unknown if currently breastfeeding.  Physical Exam:  General: alert, cooperative and no distress Lochia: appropriate Uterine Fundus: firm Incision: n/a DVT Evaluation: No evidence of DVT seen on physical exam. No significant calf/ankle edema.   Recent Labs  02/01/16 1529 02/02/16 0610  HGB 12.6 11.7*  HCT 36.8 34.7*    Assessment/Plan: Plan for discharge tomorrow, Breastfeeding, Circumcision prior to discharge and Contraception - pt and husband have agreed on vasectomy   Of note circumcision will be performed tomorrow am - baby will be >24hrs then; ordered placed; pt has been counseled  Routine pp care   LOS: 1 day   Edwinna AreolaCecilia Worema Banga 02/02/2016, 10:52 AM

## 2016-02-03 ENCOUNTER — Inpatient Hospital Stay (HOSPITAL_COMMUNITY): Admission: RE | Admit: 2016-02-03 | Payer: 59 | Source: Ambulatory Visit

## 2016-02-03 MED ORDER — IBUPROFEN 600 MG PO TABS: 600.0000 mg | ORAL_TABLET | Freq: Four times a day (QID) | ORAL | 0 refills | Status: AC

## 2016-02-03 NOTE — Lactation Note (Signed)
This note was copied from a baby's chart. Lactation Consultation Note  Patient Name: Cassidy Schroeder Today's Date: 02/03/2016  Mom states feedings at breast are going well.  Denies questions or concerns.  Lactation outpatient services and support information reviewed and encouraged.   Maternal Data    Feeding    LATCH Score/Interventions                      Lactation Tools Discussed/Used     Consult Status      Huston FoleyMOULDEN, Rashawnda Gaba S 02/03/2016, 12:20 PM

## 2016-02-03 NOTE — Discharge Summary (Signed)
OB Discharge Summary     Patient Name: Cassidy Schroeder DOB: Dec 15, 1980 MRN: [id number]  Date of admission: 02/01/2016 Delivering MD: Pryor Ochoa [facility]   Date of discharge: 02/03/2016  Admitting diagnosis: 39.4 WKS, INDUCTION Intrauterine pregnancy: [redacted]w[redacted]d     Secondary diagnosis:  Active Problems:   Labor and delivery, indication for care   Term pregnancy   SVD (spontaneous vaginal delivery)   Postpartum care following vaginal delivery  Additional problems: none     Discharge diagnosis: Term Pregnancy Delivered                                                                                                Post partum procedures:none  Augmentation: Pitocin  Complications: None  Hospital course:  Induction of Labor With Vaginal Delivery   35 y.o. yo [id number] at [redacted]w[redacted]d was admitted to the hospital 02/01/2016 for induction of labor.  Indication for induction: Favorable cervix at term.  Patient had an uncomplicated labor course as follows: Membrane Rupture Time/Date: 5:50 PM ,02/01/2016   Intrapartum Procedures: Episiotomy: Median [2]                                         Lacerations:  2nd degree [3]  Patient had delivery of a Viable infant.  Information for the patient's newborn:  [place] [[id number]]  Delivery Method: Vaginal, Spontaneous Delivery (Filed from Delivery Summary)   02/01/2016  Details of delivery can be found in separate delivery note.  Patient had a routine postpartum course. Patient is discharged home 02/03/16.   Physical exam Vitals:   02/02/16 0615 02/02/16 1418 02/02/16 2019 02/02/16 2348  BP: 98/60 93/67 100/68 96/64  Pulse: 79 93 93 92  Resp: 18 18 18 18   Temp: 98.3 F (36.8 C) 98.5 F (36.9 C) 98.5 F (36.9 C) 98.2 F (36.8 C)  TempSrc: Oral Oral Oral Oral  SpO2: 99%  98% 99%  Weight:      Height:       General: alert and cooperative Lochia: appropriate Uterine Fundus: firm  Labs: Lab Results  Component Value Date   WBC  13.7 (H) 02/02/2016   HGB 11.7 (L) 02/02/2016   HCT 34.7 (L) 02/02/2016   MCV 91.6 02/02/2016   PLT 216 02/02/2016   No flowsheet data found.  Discharge instruction: per After Visit Summary and "Baby and Me Booklet".  After visit meds:    Medication List    TAKE these medications   ibuprofen 600 MG tablet Commonly known as:  ADVIL,MOTRIN Take 1 tablet (600 mg total) by mouth every 6 (six) hours.   prenatal multivitamin Tabs tablet Take 1 tablet by mouth daily.       Diet: routine diet  Activity: Advance as tolerated. Pelvic rest for 6 weeks.   Outpatient follow up:6 weeks Follow up Appt:No future appointments. Follow up Visit:No Follow-up on file.  Postpartum contraception: Not Discussed  Newborn Data: Live born female  Birth Weight: 8 lb 0.2 oz (3635 g) APGAR: 8,  9  Baby Feeding: Breast Disposition:home with mother   02/03/2016 Oliver PilaICHARDSON,Luchiano Viscomi W, MD

## 2016-02-03 NOTE — Progress Notes (Signed)
Post Partum Day 2 Subjective: no complaints and tolerating PO  Objective: Blood pressure 96/64, pulse 92, temperature 98.2 F (36.8 C), temperature source Oral, resp. rate 18, height 5\' 5"  (1.651 m), weight 73 kg (161 lb), SpO2 99 %, unknown if currently breastfeeding.  Physical Exam:  General: alert and cooperative Lochia: appropriate Uterine Fundus: firm I3}   Recent Labs  02/01/16 1529 02/02/16 0610  HGB 12.6 11.7*  HCT 36.8 34.7*    Assessment/Plan: Discharge home   LOS: 2 days   Liesa Tsan W 02/03/2016, 10:47 AM

## 2016-02-03 NOTE — Progress Notes (Signed)
Pt discharged with instructins. Verbalizes and understanding. No concerns noted.  Carmelina DaneERRI L Kenedee Molesky, RN

## 2016-10-02 IMAGING — CR DG CHEST 2V
2 series · 2 of 2 positions shown · non-contrast
Comparison: None.

CLINICAL DATA: Cough for 2 weeks. Shortness of breath for 2 days.
Initial encounter.

EXAM:
CHEST  2 VIEW

[chest pa]
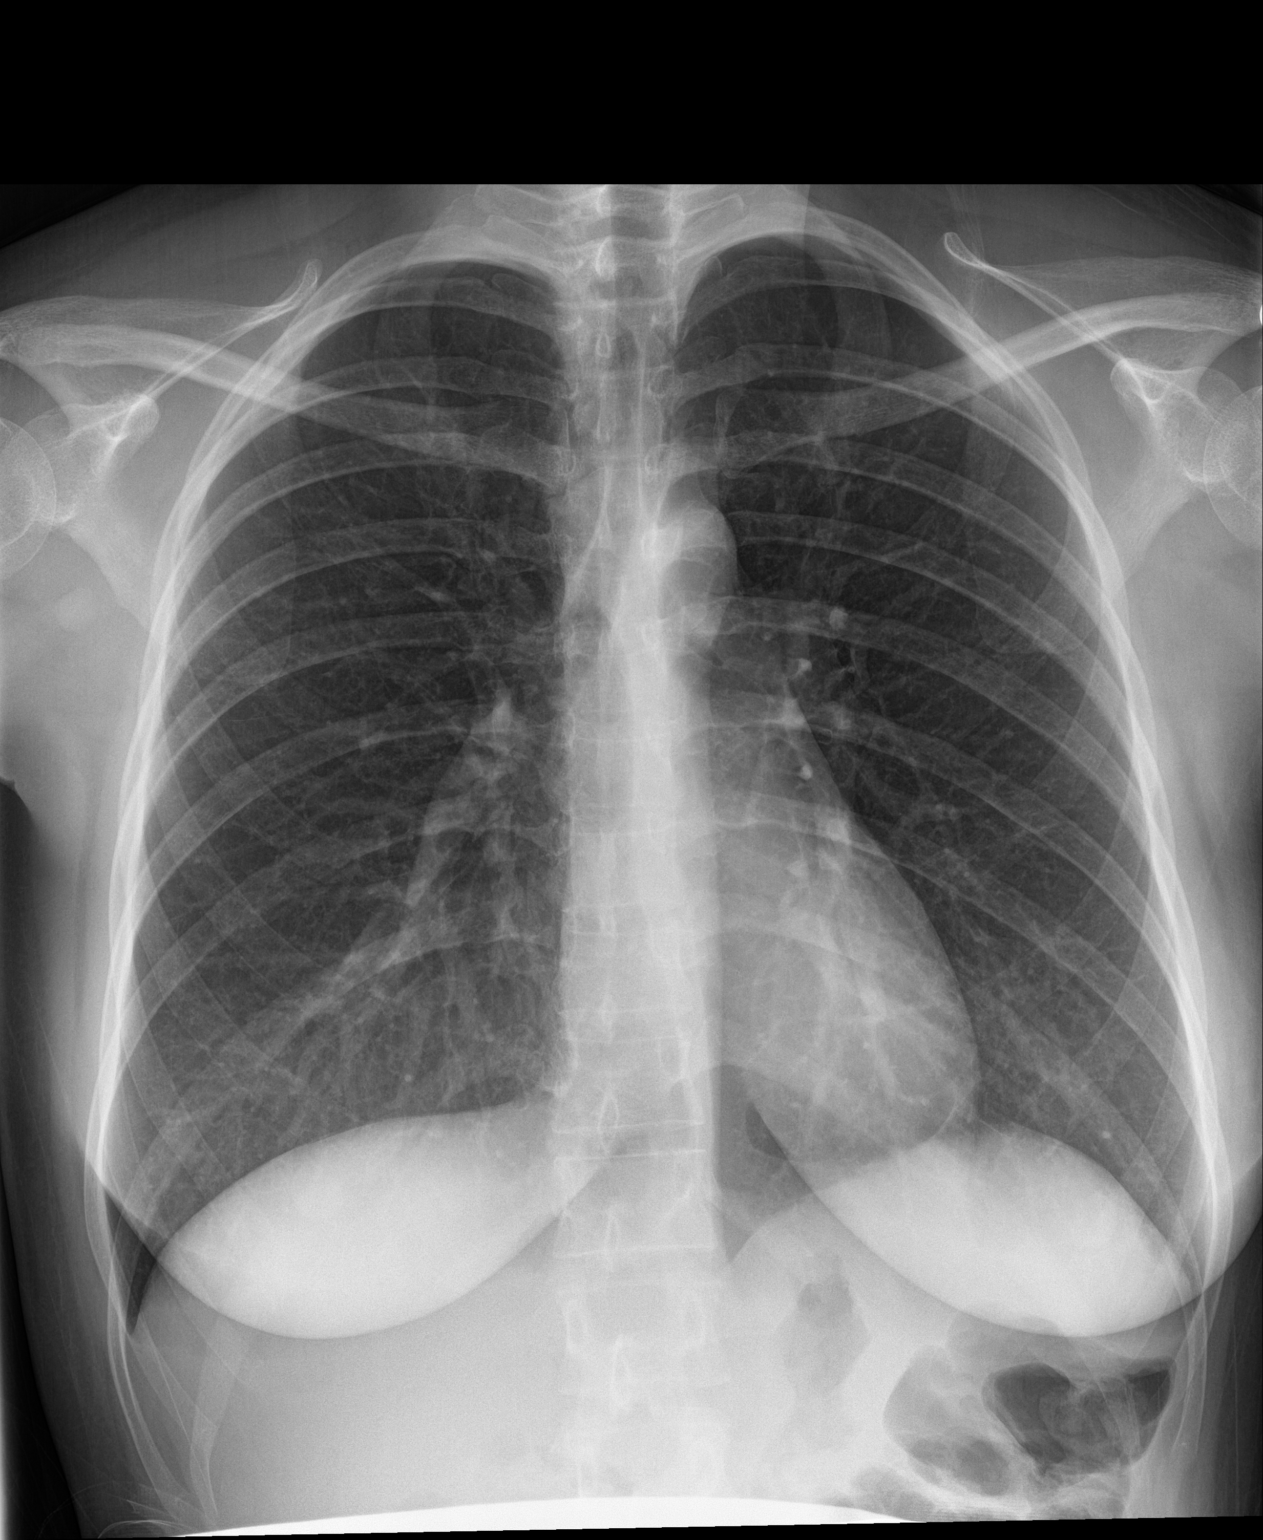

[chest lat]
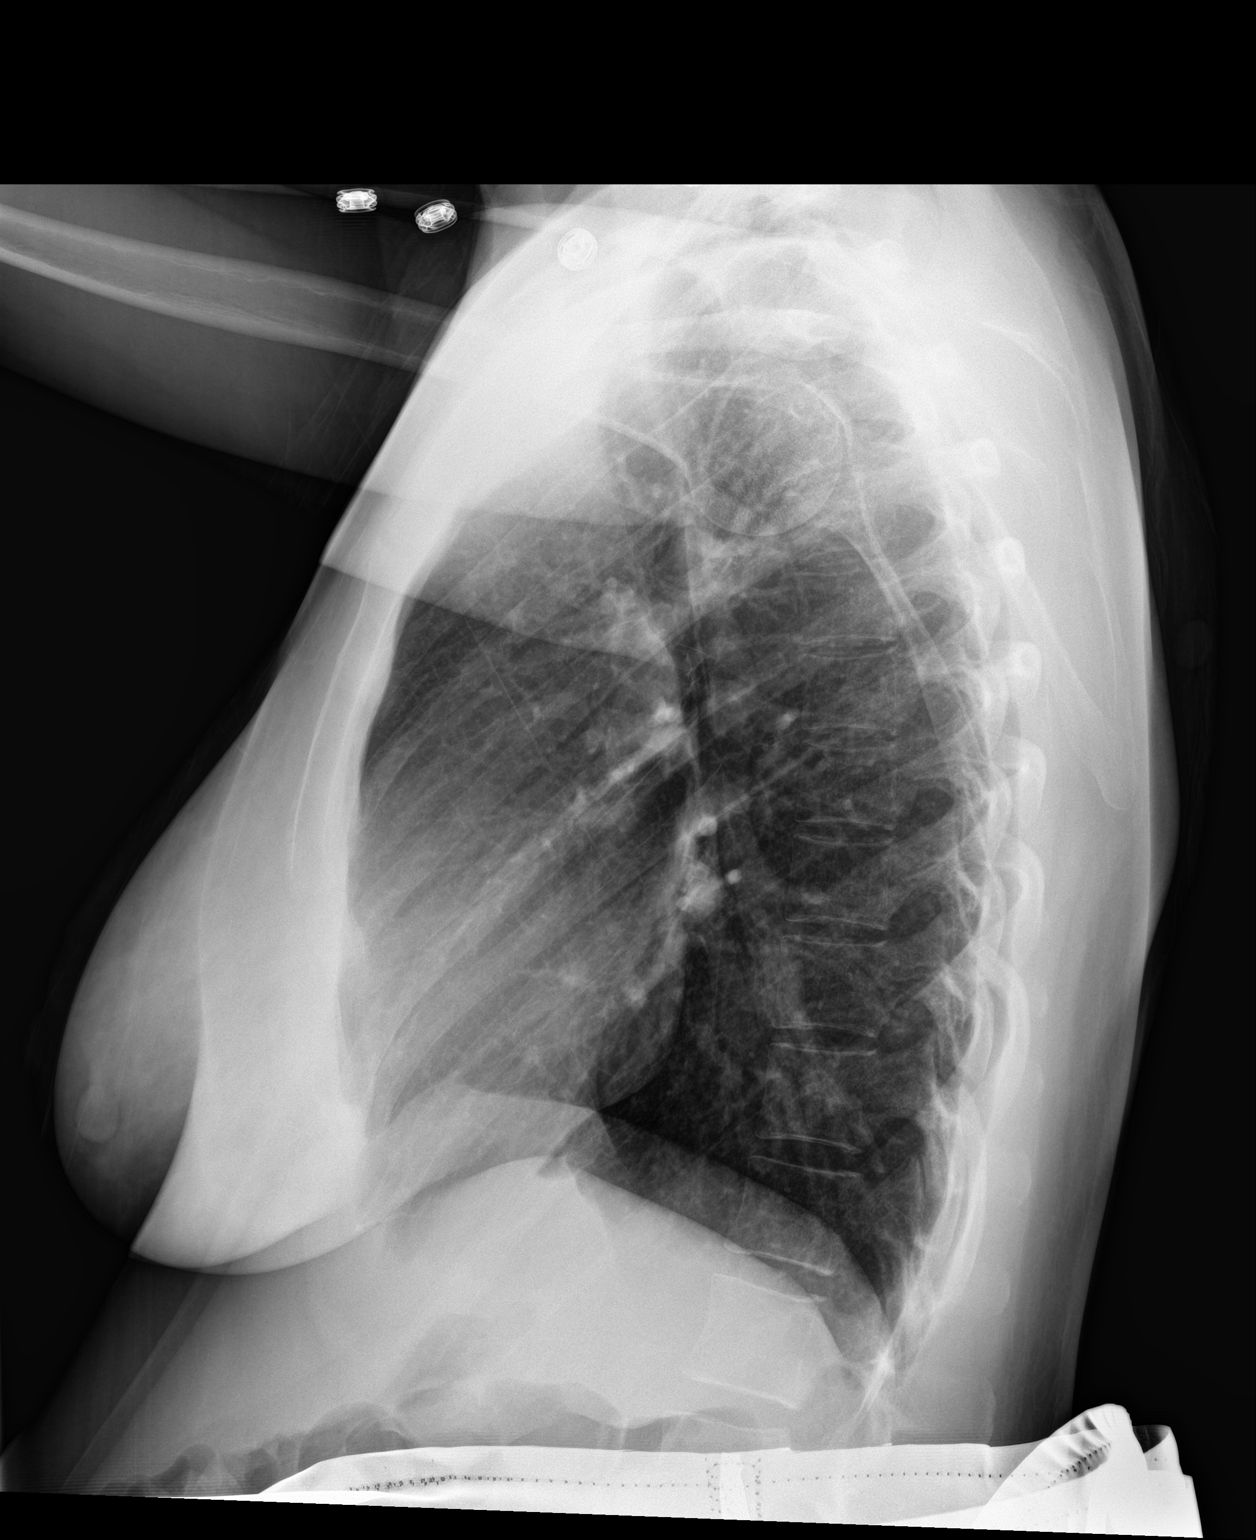

[2 of 2 positions shown; findings below may reference images not displayed]

FINDINGS: The lungs are clear. Heart size is normal. No pneumothorax or
pleural effusion. No bony abnormality.
IMPRESSION: Negative chest.

## 2017-02-01 ENCOUNTER — Other Ambulatory Visit: Payer: Self-pay | Admitting: Obstetrics and Gynecology

## 2017-02-01 DIAGNOSIS — N644 Mastodynia: Secondary | ICD-10-CM

## 2017-02-03 ENCOUNTER — Ambulatory Visit
Admission: RE | Admit: 2017-02-03 | Discharge: 2017-02-03 | Disposition: A | Discharge status: Home / Self Care | Payer: Medicaid Other | Source: Ambulatory Visit | Attending: Obstetrics and Gynecology | Admitting: Obstetrics and Gynecology

## 2017-02-03 DIAGNOSIS — N644 Mastodynia: Secondary | ICD-10-CM

## 2017-02-04 ENCOUNTER — Other Ambulatory Visit: Payer: 59
